# Patient Record
Sex: Female | Born: 1995 | Race: White | Hispanic: No | Marital: Single | Smoking: Never smoker
Health system: Southern US, Community
[De-identification: ages and names within clinical notes are randomized; demographics above are authoritative.]

## PROBLEM LIST (undated history)

## (undated) DIAGNOSIS — E611 Iron deficiency: Secondary | ICD-10-CM

## (undated) DIAGNOSIS — R519 Headache, unspecified: Secondary | ICD-10-CM

## (undated) DIAGNOSIS — J45909 Unspecified asthma, uncomplicated: Secondary | ICD-10-CM

## (undated) HISTORY — DX: Unspecified asthma, uncomplicated: J45.909

## (undated) HISTORY — PX: NO PAST SURGERIES: SHX2092

## (undated) HISTORY — DX: Iron deficiency: E61.1

## (undated) HISTORY — DX: Headache, unspecified: R51.9

---

## 2019-04-17 ENCOUNTER — Encounter: Payer: Self-pay | Admitting: *Deleted

## 2019-04-17 ENCOUNTER — Other Ambulatory Visit: Payer: Self-pay

## 2019-04-17 ENCOUNTER — Ambulatory Visit: Payer: Medicaid Other | Admitting: Neurology

## 2019-04-17 ENCOUNTER — Encounter: Payer: Self-pay | Admitting: Neurology

## 2019-04-17 VITALS — BP 103/64 | HR 100 | Temp 98.6°F | Wt 132.5 lb

## 2019-04-17 DIAGNOSIS — IMO0002 Reserved for concepts with insufficient information to code with codable children: Secondary | ICD-10-CM

## 2019-04-17 DIAGNOSIS — R9089 Other abnormal findings on diagnostic imaging of central nervous system: Secondary | ICD-10-CM | POA: Insufficient documentation

## 2019-04-17 DIAGNOSIS — G43709 Chronic migraine without aura, not intractable, without status migrainosus: Secondary | ICD-10-CM | POA: Diagnosis not present

## 2019-04-17 MED ORDER — MAGNESIUM OXIDE 400 MG PO CAPS: 400.0000 mg | ORAL_CAPSULE | Freq: Two times a day (BID) | ORAL | 11 refills | Status: DC

## 2019-04-17 MED ORDER — RIBOFLAVIN 100 MG PO CAPS: 100.0000 mg | ORAL_CAPSULE | Freq: Two times a day (BID) | ORAL | 11 refills | Status: DC

## 2019-04-17 MED ORDER — BUTALBITAL-APAP-CAFFEINE 50-325-40 MG PO TABS: 1.0000 | ORAL_TABLET | Freq: Four times a day (QID) | ORAL | 5 refills | Status: DC | PRN

## 2019-04-17 NOTE — Progress Notes (Addendum)
PATIENT: Monique HailStephanie Flores DOB: 11/25/1995  Chief Complaint  Patient presents with  . Headache    She is 30 weeks with her fourth child. She is here with her mother, Monique Flores. She estimates getting a headache every 1-2 days.  She takes Tylenol which mildly helps.  She sometimes has nausea, vomiting, blurred vision and light/noise sensitivity.   Marland Kitchen. PCP    Monique Flores, Monique Flores, Monique Flores     HISTORICAL   Monique Flores is a [age] year old female, seen in request by her primary care physician Monique Flores, Monique Flores for evaluation of headaches, she is currently [redacted] weeks pregnant, she is accompanied by her mother Monique Flores at today'Flores clinic visit on April 17, 2019.  I have reviewed and summarized the referring note from the referring physician.  She began to have migraine headaches since 23 years old, her typical migraine are bilateral frontal retro-orbital area pounding headache with associated light noise sensitivity, nauseous, lasting for few hours to few days  Over the past few years, she complains of increased frequency of migraine headache, especially since her most recent pregnancy, since 23, she been having headaches 2-3 times each week, she usually suffers through it, recently tried Tylenol with limited help, has never taken preventive medication or triptan in the past  Patient reported she was seen by [facility] in the past for abnormal MRI of the brain, around 23 years old, had a yearly MRI brain follow-up for a while, eventually lost to follow-up.  She denies visual loss, no lateralized motor or sensory deficit   REVIEW OF SYSTEMS: Full 14 system review of systems performed and notable only for as above All other review of systems were negative.  ALLERGIES: No Known Allergies  HOME MEDICATIONS: Current Outpatient Medications  Medication Sig Dispense Refill  . Ascorbic Acid (VITAMIN C PO) Take 1 tablet by mouth daily.    . Ferrous Sulfate (IRON) 325 (65 Fe) MG TABS Take 1 capsule by  mouth daily.    . Prenatal Vit-Fe Fumarate-FA (PRENATAL MULTIVITAMIN) TABS tablet Take 1 tablet by mouth daily at 12 noon.     No current facility-administered medications for this visit.     PAST MEDICAL HISTORY: Past Medical History:  Diagnosis Date  . Asthma   . Headache   . Iron deficiency     PAST SURGICAL HISTORY: Past Surgical History:  Procedure Laterality Date  . NO PAST SURGERIES      FAMILY HISTORY: Family History  Problem Relation Age of Onset  . Other Father        unsure of hx - not regularly involved - reported drug abuser  . Diabetes Maternal Grandmother   . Hypertension Maternal Grandfather   . Hypertension Mother     SOCIAL HISTORY: Social History   Socioeconomic History  . Marital status: Single    Spouse name: Not on file  . Number of children: 3  . Years of education: 5512  . Highest education level: Not on file  Occupational History  . Occupation: monitors machines  Social Needs  . Financial resource strain: Not on file  . Food insecurity    Worry: Not on file    Inability: Not on file  . Transportation needs    Medical: Not on file    Non-medical: Not on file  Tobacco Use  . Smoking status: Never Smoker  . Smokeless tobacco: Never Used  Substance and Sexual Activity  . Alcohol use: Not Currently  . Drug use: Not Currently  .  Sexual activity: Not on file  Lifestyle  . Physical activity    Days per week: Not on file    Minutes per session: Not on file  . Stress: Not on file  Relationships  . Social Herbalist on phone: Not on file    Gets together: Not on file    Attends religious service: Not on file    Active member of club or organization: Not on file    Attends meetings of clubs or organizations: Not on file    Relationship status: Not on file  . Intimate partner violence    Fear of current or ex partner: Not on file    Emotionally abused: Not on file    Physically abused: Not on file    Forced sexual activity:  Not on file  Other Topics Concern  . Not on file  Social History Narrative   Lives at home with fiance, children and maternal aunt.  She has three children.  Pregnant with fourth with EDD 06/23/2019.   Right-handed.   Caffeine use:  2-3 cups per day.     PHYSICAL EXAM   Vitals:   04/17/19 1522  BP: 103/64  Pulse: 100  Temp: 98.6 F (37 C)  Weight: 132 lb 8 oz (60.1 kg)    Not recorded      Body mass index is 23.85 kg/m.  PHYSICAL EXAMNIATION:  Gen: NAD, conversant, well nourised, well groomed                     Cardiovascular: Regular rate rhythm, no peripheral edema, warm, nontender. Eyes: Conjunctivae clear without exudates or hemorrhage Neck: Supple, no carotid bruits. Pulmonary: Clear to auscultation bilaterally   NEUROLOGICAL EXAM:  MENTAL STATUS: Speech:    Speech is normal; fluent and spontaneous with normal comprehension.  Cognition:     Orientation to time, place and person     Normal recent and remote memory     Normal Attention span and concentration     Normal Language, naming, repeating,spontaneous speech     Fund of knowledge   CRANIAL NERVES: CN II: Visual fields are full to confrontation.  Pupils are round equal and briskly reactive to light. CN III, IV, VI: extraocular movement are normal. No ptosis. CN V: Facial sensation is intact to pinprick in all 3 divisions bilaterally. Corneal responses are intact.  CN VII: Face is symmetric with normal eye closure and smile. CN VIII: Hearing is normal to causal conversation. CN IX, X: Palate elevates symmetrically. Phonation is normal. CN XI: Head turning and shoulder shrug are intact CN XII: Tongue is midline with normal movements and no atrophy.  MOTOR: There is no pronator drift of out-stretched arms. Muscle bulk and tone are normal. Muscle strength is normal.  REFLEXES: Reflexes are 2+ and symmetric at the biceps, triceps, knees, and ankles. Plantar responses are flexor.  SENSORY: Intact to  light touch, pinprick, positional sensation and vibratory sensation are intact in fingers and toes.  COORDINATION: Rapid alternating movements and fine finger movements are intact. There is no dysmetria on finger-to-nose and heel-knee-shin.    GAIT/STANCE: Posture is normal. Gait is steady with normal steps, base, arm swing, and turning. Heel and toe walking are normal. Tandem gait is normal.  Romberg is absent.   DIAGNOSTIC DATA (LABS, IMAGING, TESTING) - I reviewed patient records, labs, notes, testing and imaging myself where available.   ASSESSMENT AND PLAN  Evvie Behrmann is a 23 y.o.  female   Chronic migraine headaches  Magnesium oxide 400 mg, riboflavin 100 mg twice a day as preventive medications  Fioricet as needed  May consider repeat MRI of the brain, triptan treatment after delivery, she is planning on nursing  Levert Feinstein, M.D. Ph.D.  [facility] [address], [address] [place], [place] [address] Ph: [phone] Fax: [phone]  CC: Monique Ponto, Monique Flores

## 2019-07-24 ENCOUNTER — Ambulatory Visit: Payer: Medicaid Other | Admitting: Neurology

## 2019-07-24 ENCOUNTER — Telehealth: Payer: Self-pay | Admitting: *Deleted

## 2019-07-24 NOTE — Telephone Encounter (Signed)
Pt called at 12:03 to cancel 1:30 appt.

## 2019-09-18 ENCOUNTER — Encounter: Payer: Self-pay | Admitting: Neurology

## 2019-09-18 ENCOUNTER — Ambulatory Visit: Payer: Medicaid Other | Admitting: Neurology

## 2019-09-18 ENCOUNTER — Telehealth: Payer: Self-pay | Admitting: *Deleted

## 2019-09-18 NOTE — Telephone Encounter (Signed)
No showed follow up appointment. 

## 2019-10-28 ENCOUNTER — Ambulatory Visit (INDEPENDENT_AMBULATORY_CARE_PROVIDER_SITE_OTHER): Payer: Medicaid Other | Admitting: Neurology

## 2019-10-28 ENCOUNTER — Encounter: Payer: Self-pay | Admitting: Neurology

## 2019-10-28 ENCOUNTER — Telehealth: Payer: Self-pay | Admitting: Neurology

## 2019-10-28 ENCOUNTER — Other Ambulatory Visit: Payer: Self-pay

## 2019-10-28 VITALS — BP 94/68 | HR 70 | Temp 98.3°F | Ht 62.5 in | Wt 118.5 lb

## 2019-10-28 DIAGNOSIS — IMO0002 Reserved for concepts with insufficient information to code with codable children: Secondary | ICD-10-CM

## 2019-10-28 DIAGNOSIS — R9089 Other abnormal findings on diagnostic imaging of central nervous system: Secondary | ICD-10-CM

## 2019-10-28 DIAGNOSIS — G43709 Chronic migraine without aura, not intractable, without status migrainosus: Secondary | ICD-10-CM | POA: Diagnosis not present

## 2019-10-28 MED ORDER — ONDANSETRON 4 MG PO TBDP: 4.0000 mg | ORAL_TABLET | Freq: Three times a day (TID) | ORAL | 6 refills | Status: AC | PRN

## 2019-10-28 MED ORDER — NORTRIPTYLINE HCL 25 MG PO CAPS: 50.0000 mg | ORAL_CAPSULE | Freq: Every day | ORAL | 11 refills | Status: DC

## 2019-10-28 MED ORDER — RIZATRIPTAN BENZOATE 10 MG PO TBDP
10.0000 mg | ORAL_TABLET | ORAL | 6 refills | Status: DC | PRN
Start: 2019-10-28 — End: 2021-07-12

## 2019-10-28 NOTE — Telephone Encounter (Signed)
Medicaid order sent to [facility]. They will obtain the auth and reach out to the patient to schedule.  

## 2019-10-28 NOTE — Progress Notes (Signed)
PATIENT: Monique Flores DOB: Mar 07, 1996  Chief Complaint  Patient presents with  . Flores    She is no longer taking any medications or supplements to prevent migraines. Reports having a headache nearly every day in varying degrees of pain. She takes Monique Flores which does not provide much relief. She delivered her fourth girl on 06/01/20 without incident. Her other daughters are ages 69.4 and 1.      HISTORICAL   Monique Flores is a 24 year old female, seen in request by her primary care physician Dr. Leonor Flores, Mendel Corning for evaluation of headaches, she is currently [redacted] weeks pregnant, she is accompanied by her mother Monique Flores at today's clinic visit on April 17, 2019.  I have reviewed and summarized the referring note from the referring physician.  She began to have Flores headaches since 24 years old, her typical Flores are bilateral frontal retro-orbital area pounding headache with associated light noise sensitivity, nauseous, lasting for few hours to few days  Over the past few years, she complains of increased frequency of Flores headache, especially since her most recent pregnancy, since 2019, she been having headaches 2-3 times each week, she usually suffers through it, recently tried Monique Flores with limited help, has never taken preventive medication or triptan in the past  Patient reported she was seen by [facility] Flores neurology in the past for abnormal MRI of the brain, around 24 years old, had a yearly MRI brain follow-up for a while, eventually lost to follow-up.  She denies visual loss, no lateralized motor or sensory deficit  Update Oct 28, 2019: She had a healthy delivery in December 2020, continue complains daily headache, moderate to severe, sometimes 10 out of 10, with associated light, noise, smell sensitivity, lasting 1 to 3 days, she has tried over-the-counter Monique Flores, Monique Flores with limited help  She denies focal symptoms, she also complains of poor sleep quality, she  is a mother of 4 children at age 74, 20, 19 years old and 68 months old, also work part-time job during the weekend   REVIEW OF SYSTEMS: Full 14 system review of systems performed and notable only for as above All other review of systems were negative.  ALLERGIES: No Known Allergies  HOME MEDICATIONS: Current Outpatient Medications  Medication Sig Dispense Refill  . medroxyPROGESTERone (DEPO-PROVERA) 150 MG/ML injection Inject 150 mg into the muscle every 3 (three) months.     No current facility-administered medications for this visit.    PAST MEDICAL HISTORY: Past Medical History:  Diagnosis Date  . Asthma   . Headache   . Iron deficiency     PAST SURGICAL HISTORY: Past Surgical History:  Procedure Laterality Date  . NO PAST SURGERIES      FAMILY HISTORY: Family History  Problem Relation Age of Onset  . Other Father        unsure of hx - not regularly involved - reported drug abuser  . Diabetes Maternal Grandmother   . Hypertension Maternal Grandfather   . Hypertension Mother     SOCIAL HISTORY: Social History   Socioeconomic History  . Marital status: Single    Spouse name: Not on file  . Number of children: 4  . Years of education: 4  . Highest education level: Not on file  Occupational History  . Occupation: monitors machines  Tobacco Use  . Smoking status: Never Smoker  . Smokeless tobacco: Never Used  Substance and Sexual Activity  . Alcohol use: Not Currently  . Drug use: Not Currently  .  Sexual activity: Not on file  Other Topics Concern  . Not on file  Social History Narrative   Lives at home with fiance, children and maternal aunt.  She has three children.  Pregnant with fourth with EDD 06/23/2019.   Right-handed.   Caffeine use:  2-3 cups per day.   Social Determinants of Health   Financial Resource Strain:   . Difficulty of Paying Living Expenses:   Food Insecurity:   . Worried About Charity fundraiser in the Last Year:   . Academic librarian in the Last Year:   Transportation Needs:   . Film/video editor (Medical):   Marland Kitchen Lack of Transportation (Non-Medical):   Physical Activity:   . Days of Exercise per Week:   . Minutes of Exercise per Session:   Stress:   . Feeling of Stress :   Social Connections:   . Frequency of Communication with Friends and Family:   . Frequency of Social Gatherings with Friends and Family:   . Attends Religious Services:   . Active Member of Clubs or Organizations:   . Attends Archivist Meetings:   Marland Kitchen Marital Status:   Intimate Partner Violence:   . Fear of Current or Ex-Partner:   . Emotionally Abused:   Marland Kitchen Physically Abused:   . Sexually Abused:      PHYSICAL EXAM   Vitals:   10/28/19 1125  BP: 94/68  Pulse: 70  Temp: 98.3 F (36.8 C)  Weight: 118 lb 8 oz (53.8 kg)  Height: 5' 2.5" (1.588 m)    Not recorded      Body mass index is 21.33 kg/m.  PHYSICAL EXAMNIATION:  Gen: NAD, conversant, well nourised, well groomed                     Cardiovascular: Regular rate rhythm, no peripheral edema, warm, nontender. Eyes: Conjunctivae clear without exudates or hemorrhage Neck: Supple, no carotid bruits. Pulmonary: Clear to auscultation bilaterally   NEUROLOGICAL EXAM:  MENTAL STATUS: Speech:    Speech is normal; fluent and spontaneous with normal comprehension.  Cognition:     Orientation to time, place and person     Normal recent and remote memory     Normal Attention span and concentration     Normal Language, naming, repeating,spontaneous speech     Fund of knowledge   CRANIAL NERVES: CN II: Visual fields are full to confrontation.  Pupils are round equal and briskly reactive to light. CN III, IV, VI: extraocular movement are normal. No ptosis. CN V: Facial sensation is intact to pinprick in all 3 divisions bilaterally. Corneal responses are intact.  CN VII: Face is symmetric with normal eye closure and smile. CN VIII: Hearing is normal to causal  conversation. CN IX, X: Palate elevates symmetrically. Phonation is normal. CN XI: Head turning and shoulder shrug are intact CN XII: Tongue is midline with normal movements and no atrophy.  MOTOR: There is no pronator drift of out-stretched arms. Muscle bulk and tone are normal. Muscle strength is normal.  REFLEXES: Reflexes are 2+ and symmetric at the biceps, triceps, knees, and ankles. Plantar responses are flexor.  SENSORY: Intact to light touch, pinprick, positional sensation and vibratory sensation are intact in fingers and toes.  COORDINATION: Rapid alternating movements and fine finger movements are intact. There is no dysmetria on finger-to-nose and heel-knee-shin.    GAIT/STANCE: Posture is normal. Gait is steady with normal steps, base, arm swing, and  turning. Heel and toe walking are normal. Tandem gait is normal.  Romberg is absent.   DIAGNOSTIC DATA (LABS, IMAGING, TESTING) - I reviewed patient records, labs, notes, testing and imaging myself where available.   ASSESSMENT AND PLAN  Monique Flores is a 24 y.o. female   Chronic Flores headaches  She had a healthy delivery in December 2020, no longer nursing  Continue complains of daily headaches, failed to respond to over-the-counter medication,  Maxalt as needed, may combine with Aleve, Zofran  Nortriptyline 25 mg titrating to 50 mg every night as preventive medication  Abnormal MRI of brain  Repeat MRI of the brain with without contrast  Laboratory evaluation including BUN/creatinine    Levert Feinstein, M.D. Ph.D.  [facility] [address] [place], [place] [address] Ph: [phone] Fax: [phone]  CC: Marylen Ponto, MD

## 2019-10-29 ENCOUNTER — Telehealth: Payer: Self-pay | Admitting: Neurology

## 2019-10-29 LAB — COMPREHENSIVE METABOLIC PANEL
ALT: 8 IU/L (ref 0–32)
AST: 15 IU/L (ref 0–40)
Albumin/Globulin Ratio: 1.8 (ref 1.2–2.2)
Albumin: 4.4 g/dL (ref 3.9–5.0)
Alkaline Phosphatase: 75 IU/L (ref 39–117)
BUN/Creatinine Ratio: 10 (ref 9–23)
BUN: 9 mg/dL (ref 6–20)
Bilirubin Total: 0.6 mg/dL (ref 0.0–1.2)
CO2: 22 mmol/L (ref 20–29)
Calcium: 9.1 mg/dL (ref 8.7–10.2)
Chloride: 103 mmol/L (ref 96–106)
Creatinine, Ser: 0.88 mg/dL (ref 0.57–1.00)
GFR calc Af Amer: 107 mL/min/{1.73_m2} (ref >59)
GFR calc non Af Amer: 93 mL/min/{1.73_m2} (ref >59)
Globulin, Total: 2.4 g/dL (ref 1.5–4.5)
Glucose: 74 mg/dL (ref 65–99)
Potassium: 4.2 mmol/L (ref 3.5–5.2)
Sodium: 139 mmol/L (ref 134–144)
Total Protein: 6.8 g/dL (ref 6.0–8.5)

## 2019-10-29 LAB — CBC WITH DIFFERENTIAL/PLATELET
Basophils Absolute: 0.1 10*3/uL (ref 0.0–0.2)
Basos: 1 %
EOS (ABSOLUTE): 0.2 10*3/uL (ref 0.0–0.4)
Eos: 4 %
Hematocrit: 36.5 % (ref 34.0–46.6)
Hemoglobin: 11.4 g/dL (ref 11.1–15.9)
Immature Grans (Abs): 0 10*3/uL (ref 0.0–0.1)
Immature Granulocytes: 0 %
Lymphocytes Absolute: 1.7 10*3/uL (ref 0.7–3.1)
Lymphs: 35 %
MCH: 23.9 pg — ABNORMAL LOW (ref 26.6–33.0)
MCHC: 31.2 g/dL — ABNORMAL LOW (ref 31.5–35.7)
MCV: 77 fL — ABNORMAL LOW (ref 79–97)
Monocytes Absolute: 0.5 10*3/uL (ref 0.1–0.9)
Monocytes: 11 %
Neutrophils Absolute: 2.5 10*3/uL (ref 1.4–7.0)
Neutrophils: 49 %
Platelets: 311 10*3/uL (ref 150–450)
RBC: 4.76 x10E6/uL (ref 3.77–5.28)
RDW: 14.5 % (ref 11.7–15.4)
WBC: 5 10*3/uL (ref 3.4–10.8)

## 2019-10-29 LAB — TSH: TSH: 0.793 u[IU]/mL (ref 0.450–4.500)

## 2019-10-29 NOTE — Telephone Encounter (Signed)
Left patient a detailed message, with results and instructions to follow up with PCP (ok per DPR).  Provided our number to call back with any questions.

## 2019-10-29 NOTE — Addendum Note (Signed)
Addended by: Levert Feinstein on: 10/29/2019 09:11 AM   Modules accepted: Level of Service

## 2019-10-29 NOTE — Telephone Encounter (Signed)
Please call patient, laboratory evaluation showed low normal range of hemoglobin, with evidence of small red blood cells, she may continue follow-up with her primary care physician to rule out iron deficiency anemia  Rest of the laboratory evaluation was normal

## 2020-01-13 ENCOUNTER — Other Ambulatory Visit: Payer: Self-pay | Admitting: Neurology

## 2020-01-13 DIAGNOSIS — IMO0002 Reserved for concepts with insufficient information to code with codable children: Secondary | ICD-10-CM

## 2020-01-13 DIAGNOSIS — R9089 Other abnormal findings on diagnostic imaging of central nervous system: Secondary | ICD-10-CM

## 2020-01-21 NOTE — Telephone Encounter (Signed)
[employer] [facility] [place]: [id number] (exp. 01/21/20 to 03/06/20) patient is scheduled at [facility] for 01/23/20.

## 2020-01-23 ENCOUNTER — Ambulatory Visit
Admission: RE | Admit: 2020-01-23 | Discharge: 2020-01-23 | Disposition: A | Discharge status: Home / Self Care | Payer: Medicaid Other | Source: Ambulatory Visit | Attending: Neurology | Admitting: Neurology

## 2020-01-23 ENCOUNTER — Other Ambulatory Visit: Payer: Self-pay

## 2020-01-23 ENCOUNTER — Other Ambulatory Visit: Payer: Self-pay | Admitting: Neurology

## 2020-01-23 DIAGNOSIS — Z77018 Contact with and (suspected) exposure to other hazardous metals: Secondary | ICD-10-CM

## 2020-01-23 DIAGNOSIS — IMO0002 Reserved for concepts with insufficient information to code with codable children: Secondary | ICD-10-CM

## 2020-01-23 DIAGNOSIS — R9089 Other abnormal findings on diagnostic imaging of central nervous system: Secondary | ICD-10-CM

## 2020-01-23 DIAGNOSIS — G43709 Chronic migraine without aura, not intractable, without status migrainosus: Secondary | ICD-10-CM | POA: Diagnosis not present

## 2020-01-23 MED ORDER — GADOBENATE DIMEGLUMINE 529 MG/ML IV SOLN
10.0000 mL | Freq: Once | INTRAVENOUS | Status: AC | PRN
  Administered 2020-01-23: 10 mL via INTRAVENOUS

## 2020-01-26 ENCOUNTER — Telehealth: Payer: Self-pay | Admitting: Neurology

## 2020-01-26 NOTE — Telephone Encounter (Signed)
MRI of the IMPRESSION: Abnormal MRI scan of the brain with and without contrast showing nonspecific periventricular and subcortical white matter hyperintensities with the differential discussed above.  No enhancing lesions are noted.  Please call patient, brain showed right frontal subcortical area white matter hypodensity lesion, 1.3 x 0.7 cm, no contrast-enhancement, this can be migraine related, or small vessel disease, there was no acute abnormalities.

## 2020-01-26 NOTE — Telephone Encounter (Signed)
I provided the patient with her MRI brain results as noted below. She verbalized understanding. She would like to further review at her next appt on 01/29/20.

## 2020-01-29 ENCOUNTER — Ambulatory Visit: Payer: Medicaid Other | Admitting: Neurology

## 2020-01-29 ENCOUNTER — Encounter: Payer: Self-pay | Admitting: Neurology

## 2020-01-29 NOTE — Progress Notes (Deleted)
PATIENT: Monique Flores DOB: 1995-08-01  REASON FOR VISIT: follow up HISTORY FROM: patient  HISTORY OF PRESENT ILLNESS: Today 01/29/20  HISTORY Monique Flores is a 24 year old female, seen in request by her primary care physician Dr. Leonor Liv, [facility] Corning for evaluation of headaches, she is currently [redacted] weeks pregnant, she is accompanied by her mother [name] at today's clinic visit on April 17, 2019.  I have reviewed and summarized the referring note from the referring physician.  She began to have migraine headaches since 24 years old, her typical migraine are bilateral frontal retro-orbital area pounding headache with associated light noise sensitivity, nauseous, lasting for few hours to few days  Over the past few years, she complains of increased frequency of migraine headache, especially since her most recent pregnancy, since 2019, she been having headaches 2-3 times each week, she usually suffers through it, recently tried Tylenol with limited help, has never taken preventive medication or triptan in the past  Patient reported she was seen by [facility] neurology in the past for abnormal MRI of the brain, around 24 years old, had a yearly MRI brain follow-up for a while, eventually lost to follow-up.  She denies visual loss, no lateralized motor or sensory deficit  Update Oct 28, 2019: She had a healthy delivery in December 2020, continue complains daily headache, moderate to severe, sometimes 10 out of 10, with associated light, noise, smell sensitivity, lasting 1 to 3 days, she has tried over-the-counter Tylenol, Excedrin Migraine with limited help  She denies focal symptoms, she also complains of poor sleep quality, she is a mother of 4 children at age 33, 37, 63 years old and 64 months old, also work part-time job during the weekend  Update January 29, 2020 SS: Laboratory evaluation showed low normal range of hemoglobin with evidence of small RBC, recommend he continue to work with  PCP to rule out iron deficiency anemia, CMP, TSH was normal.  MRI of the brain in August 2021 showed right frontal subcortical area white matter hypodensity lesion 1.3x 0.7 cm, no contrast-enhancement, could be migraine related or small vessel disease, no acute abnormality.  REVIEW OF SYSTEMS: Out of a complete 14 system review of symptoms, the patient complains only of the following symptoms, and all other reviewed systems are negative.  ALLERGIES: No Known Allergies  HOME MEDICATIONS: Outpatient Medications Prior to Visit  Medication Sig Dispense Refill  . medroxyPROGESTERone (DEPO-PROVERA) 150 MG/ML injection Inject 150 mg into the muscle every 3 (three) months.    . nortriptyline (PAMELOR) 25 MG capsule Take 2 capsules (50 mg total) by mouth at bedtime. 60 capsule 11  . ondansetron (ZOFRAN ODT) 4 MG disintegrating tablet Take 1 tablet (4 mg total) by mouth every 8 (eight) hours as needed. 20 tablet 6  . rizatriptan (MAXALT-MLT) 10 MG disintegrating tablet Take 1 tablet (10 mg total) by mouth as needed. May repeat in 2 hours if needed 15 tablet 6   No facility-administered medications prior to visit.    PAST MEDICAL HISTORY: Past Medical History:  Diagnosis Date  . Asthma   . Headache   . Iron deficiency     PAST SURGICAL HISTORY: Past Surgical History:  Procedure Laterality Date  . NO PAST SURGERIES      FAMILY HISTORY: Family History  Problem Relation Age of Onset  . Other Father        unsure of hx - not regularly involved - reported drug abuser  . Diabetes Maternal Grandmother   . Hypertension  Maternal Grandfather   . Hypertension Mother     SOCIAL HISTORY: Social History   Socioeconomic History  . Marital status: Single    Spouse name: Not on file  . Number of children: 4  . Years of education: 85  . Highest education level: Not on file  Occupational History  . Occupation: monitors machines  Tobacco Use  . Smoking status: Never Smoker  . Smokeless  tobacco: Never Used  Substance and Sexual Activity  . Alcohol use: Not Currently  . Drug use: Not Currently  . Sexual activity: Not on file  Other Topics Concern  . Not on file  Social History Narrative   Lives at home with fiance, children and maternal aunt.  She has three children.  Pregnant with fourth with EDD 06/23/2019.   Right-handed.   Caffeine use:  2-3 cups per day.   Social Determinants of Health   Financial Resource Strain:   . Difficulty of Paying Living Expenses:   Food Insecurity:   . Worried About Programme researcher, broadcasting/film/video in the Last Year:   . Barista in the Last Year:   Transportation Needs:   . Freight forwarder (Medical):   Marland Kitchen Lack of Transportation (Non-Medical):   Physical Activity:   . Days of Exercise per Week:   . Minutes of Exercise per Session:   Stress:   . Feeling of Stress :   Social Connections:   . Frequency of Communication with Friends and Family:   . Frequency of Social Gatherings with Friends and Family:   . Attends Religious Services:   . Active Member of Clubs or Organizations:   . Attends Banker Meetings:   Marland Kitchen Marital Status:   Intimate Partner Violence:   . Fear of Current or Ex-Partner:   . Emotionally Abused:   Marland Kitchen Physically Abused:   . Sexually Abused:       PHYSICAL EXAM  There were no vitals filed for this visit. There is no height or weight on file to calculate BMI.  Generalized: Well developed, in no acute distress   Neurological examination  Mentation: Alert oriented to time, place, history taking. Follows all commands speech and language fluent Cranial nerve II-XII: Pupils were equal round reactive to light. Extraocular movements were full, visual field were full on confrontational test. Facial sensation and strength were normal. Uvula tongue midline. Head turning and shoulder shrug  were normal and symmetric. Motor: The motor testing reveals 5 over 5 strength of all 4 extremities. Good symmetric  motor tone is noted throughout.  Sensory: Sensory testing is intact to soft touch on all 4 extremities. No evidence of extinction is noted.  Coordination: Cerebellar testing reveals good finger-nose-finger and heel-to-shin bilaterally.  Gait and station: Gait is normal. Tandem gait is normal. Romberg is negative. No drift is seen.  Reflexes: Deep tendon reflexes are symmetric and normal bilaterally.   DIAGNOSTIC DATA (LABS, IMAGING, TESTING) - I reviewed patient records, labs, notes, testing and imaging myself where available.  Lab Results  Component Value Date   WBC 5.0 10/28/2019   HGB 11.4 10/28/2019   HCT 36.5 10/28/2019   MCV 77 (L) 10/28/2019   PLT 311 10/28/2019      Component Value Date/Time   NA 139 10/28/2019 1235   K 4.2 10/28/2019 1235   CL 103 10/28/2019 1235   CO2 22 10/28/2019 1235   GLUCOSE 74 10/28/2019 1235   BUN 9 10/28/2019 1235   CREATININE 0.88 10/28/2019  1235   CALCIUM 9.1 10/28/2019 1235   PROT 6.8 10/28/2019 1235   ALBUMIN 4.4 10/28/2019 1235   AST 15 10/28/2019 1235   ALT 8 10/28/2019 1235   ALKPHOS 75 10/28/2019 1235   BILITOT 0.6 10/28/2019 1235   GFRNONAA 93 10/28/2019 1235   GFRAA 107 10/28/2019 1235   No results found for: CHOL, HDL, LDLCALC, LDLDIRECT, TRIG, CHOLHDL No results found for: JSEG3T No results found for: VITAMINB12 Lab Results  Component Value Date   TSH 0.793 10/28/2019      ASSESSMENT AND PLAN 24 y.o. year old female  has a past medical history of Asthma, Headache, and Iron deficiency. here with:  1.  Chronic migraine headache 2.  Abnormal MRI of the brain -MRI of the brain in August 2021 showed right frontal subcortical area white matter hypodensity lesion 1.3x 0.7 cm, no contrast-enhancement, could be migraine related or small vessel disease, no acute abnormality.   I spent 15 minutes with the patient. 50% of this time was spent   Margie Ege, [place], DNP 01/29/2020, 5:54 AM [facility] [address] [place], [place] [address] [phone]

## 2020-04-06 ENCOUNTER — Ambulatory Visit: Payer: Medicaid Other | Admitting: Neurology

## 2020-04-06 ENCOUNTER — Encounter: Payer: Self-pay | Admitting: Neurology

## 2020-04-06 VITALS — BP 112/68 | HR 72 | Ht 62.5 in | Wt 112.2 lb

## 2020-04-06 DIAGNOSIS — G43709 Chronic migraine without aura, not intractable, without status migrainosus: Secondary | ICD-10-CM | POA: Diagnosis not present

## 2020-04-06 DIAGNOSIS — R9089 Other abnormal findings on diagnostic imaging of central nervous system: Secondary | ICD-10-CM | POA: Diagnosis not present

## 2020-04-06 NOTE — Patient Instructions (Signed)
Take nortriptyline every night at bedtime Take Maxalt as needed for acute headache Recommend starting your birth control  Return in 6 months

## 2020-04-06 NOTE — Progress Notes (Signed)
PATIENT: Monique Flores DOB: [birth year]  REASON FOR VISIT: follow up HISTORY FROM: patient  HISTORY OF PRESENT ILLNESS: Today 04/06/20  HISTORY  Monique Flores is a 24 year old female, seen in request by her primary care physician Dr. Leonor Liv, [facility] Corning for evaluation of headaches, she is currently [redacted] weeks pregnant, she is accompanied by her mother [name] at today's clinic visit on April 17, 2019.  I have reviewed and summarized the referring note from the referring physician.  She began to have migraine headaches since 24 years old, her typical migraine are bilateral frontal retro-orbital area pounding headache with associated light noise sensitivity, nauseous, lasting for few hours to few days  Over the past few years, she complains of increased frequency of migraine headache, especially since her most recent pregnancy, since 2019, she been having headaches 2-3 times each week, she usually suffers through it, recently tried Tylenol with limited help, has never taken preventive medication or triptan in the past  Patient reported she was seen by [facility] neurology in the past for abnormal MRI of the brain, around 24 years old, had a yearly MRI brain follow-up for a while, eventually lost to follow-up.  She denies visual loss, no lateralized motor or sensory deficit  Update Oct 28, 2019: She had a healthy delivery in December 2020, continue complains daily headache, moderate to severe, sometimes 10 out of 10, with associated light, noise, smell sensitivity, lasting 1 to 3 days, she has tried over-the-counter Tylenol, Excedrin Migraine with limited help  She denies focal symptoms, she also complains of poor sleep quality, she is a mother of 4 children at age 27, 45, 60 years old and 7 months old, also work part-time job during the weekend  Update April 06, 2020 SS: Laboratory evaluation revealed low normal hemoglobin 11.4, decreased MCV, MCH, MCHC, suspicious for IDA, working with  PCP.  CMP, TSH unremarkable.  MRI of the brain in August 2021 showed right frontal subcortical area white matter lesion 1.3 x 0.7 cm, no contrast-enhancement, could be migraine related or small vessel disease, no acute abnormalities.  Taking nortriptyline 50 mg on as-needed basis, 2 to 4 migraines a week, takes Maxalt with good benefit.  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  Switched birth control from Depo to pills, but has not started, apparently isn't planning for more children.  Here today accompanied by her fianc.  REVIEW OF SYSTEMS: Out of a complete 14 system review of symptoms, the patient complains only of the following symptoms, and all other reviewed systems are negative.  Headache  ALLERGIES: No Known Allergies  HOME MEDICATIONS: Outpatient Medications Prior to Visit  Medication Sig Dispense Refill   nortriptyline (PAMELOR) 25 MG capsule Take 2 capsules (50 mg total) by mouth at bedtime. 60 capsule 11   ondansetron (ZOFRAN ODT) 4 MG disintegrating tablet Take 1 tablet (4 mg total) by mouth every 8 (eight) hours as needed. 20 tablet 6   rizatriptan (MAXALT-MLT) 10 MG disintegrating tablet Take 1 tablet (10 mg total) by mouth as needed. May repeat in 2 hours if needed 15 tablet 6   medroxyPROGESTERone (DEPO-PROVERA) 150 MG/ML injection Inject 150 mg into the muscle every 3 (three) months.     No facility-administered medications prior to visit.    PAST MEDICAL HISTORY: Past Medical History:  Diagnosis Date   Asthma    Headache    Iron deficiency     PAST SURGICAL HISTORY:  Past Surgical History:  Procedure Laterality Date   NO PAST SURGERIES      FAMILY HISTORY: Family History  Problem Relation Age of Onset   Other Father        unsure of hx - not regularly involved - reported drug abuser   Diabetes Maternal Grandmother    Hypertension Maternal Grandfather    Hypertension Mother      SOCIAL HISTORY: Social History   Socioeconomic History   Marital status: Single    Spouse name: Not on file   Number of children: 4   Years of education: 12   Highest education level: Not on file  Occupational History   Occupation: monitors machines  Tobacco Use   Smoking status: Never Smoker   Smokeless tobacco: Never Used  Substance and Sexual Activity   Alcohol use: Not Currently   Drug use: Not Currently   Sexual activity: Not on file  Other Topics Concern   Not on file  Social History Narrative   Lives at home with fiance, children and maternal aunt.  She has three children.  Pregnant with fourth with EDD 06/23/2019.   Right-handed.   Caffeine use:  2-3 cups per day.   Social Determinants of Health   Financial Resource Strain:    Difficulty of Paying Living Expenses: Not on file  Food Insecurity:    Worried About Programme researcher, broadcasting/film/video in the Last Year: Not on file   [employer] of Food in the Last Year: Not on file  Transportation Needs:    Lack of Transportation (Medical): Not on file   Lack of Transportation (Non-Medical): Not on file  Physical Activity:    Days of Exercise per Week: Not on file   Minutes of Exercise per Session: Not on file  Stress:    Feeling of Stress : Not on file  Social Connections:    Frequency of Communication with Friends and Family: Not on file   Frequency of Social Gatherings with Friends and Family: Not on file   Attends Religious Services: Not on file   Active Member of Clubs or Organizations: Not on file   Attends Banker Meetings: Not on file   Marital Status: Not on file  Intimate Partner Violence:    Fear of Current or Ex-Partner: Not on file   Emotionally Abused: Not on file   Physically Abused: Not on file   Sexually Abused: Not on file   PHYSICAL EXAM  Vitals:   04/06/20 1414  BP: 112/68  Pulse: 72  Weight: 112 lb 3.2 oz (50.9 kg)  Height: 5' 2.5" (1.588 m)   Body mass  index is 20.19 kg/m.  Generalized: Well developed, in no acute distress   Neurological examination  Mentation: Alert oriented to time, place, history taking. Follows all commands speech and language fluent Cranial nerve II-XII: Pupils were equal round reactive to light. Extraocular movements were full, visual field were full on confrontational test. Facial sensation and strength were normal. Head turning and shoulder shrug were normal and symmetric. Motor: The motor testing reveals 5 over 5 strength of all 4 extremities. Good symmetric motor tone is noted throughout.  Sensory: Sensory testing is intact to soft touch on all 4 extremities. No evidence of extinction is noted.  Coordination: Cerebellar testing reveals good finger-nose-finger and heel-to-shin bilaterally.  Gait and station: Gait is normal. Reflexes: Deep tendon reflexes are symmetric and normal bilaterally.   DIAGNOSTIC DATA (LABS, IMAGING, TESTING) - I reviewed patient records, labs,  notes, testing and imaging myself where available.  Lab Results  Component Value Date   WBC 5.0 10/28/2019   HGB 11.4 10/28/2019   HCT 36.5 10/28/2019   MCV 77 (L) 10/28/2019   PLT 311 10/28/2019      Component Value Date/Time   NA 139 10/28/2019 1235   K 4.2 10/28/2019 1235   CL 103 10/28/2019 1235   CO2 22 10/28/2019 1235   GLUCOSE 74 10/28/2019 1235   BUN 9 10/28/2019 1235   CREATININE 0.88 10/28/2019 1235   CALCIUM 9.1 10/28/2019 1235   PROT 6.8 10/28/2019 1235   ALBUMIN 4.4 10/28/2019 1235   AST 15 10/28/2019 1235   ALT 8 10/28/2019 1235   ALKPHOS 75 10/28/2019 1235   BILITOT 0.6 10/28/2019 1235   GFRNONAA 93 10/28/2019 1235   GFRAA 107 10/28/2019 1235   No results found for: CHOL, HDL, LDLCALC, LDLDIRECT, TRIG, CHOLHDL No results found for: ZYSA6T No results found for: VITAMINB12 Lab Results  Component Value Date   TSH 0.793 10/28/2019      ASSESSMENT AND PLAN 24 y.o. year old female  has a past medical history of  Asthma, Headache, and Iron deficiency. here with:  1.  Chronic migraine headache -2-4 migraines a week, taking nortriptyline PRN -Start taking nortriptyline working up to 50 mg at bedtime EVERY night for migraine prevention -Continue Maxalt as needed for acute headache -Talked about starting her birth control she has been prescribed -Follow-up here in 6 months or sooner if needed  2.  Abnormal MRI of the brain -MRI of the brain in August 2021 showed right frontal subcortical area white matter hypodensity lesion, 1.3 x 0.7 cm, no contrast-enhancement, could be migraine related or small vessel disease, no acute abnormalities  I spent 20 minutes of face-to-face and non-face-to-face time with patient.  This included previsit chart review, lab review, study review, order entry, electronic health record documentation, patient education.  Margie Ege, AGNP-C, DNP 04/06/2020, 2:39 PM [facility] [address], [address] [place], [place] [address] [phone]

## 2020-04-28 NOTE — Progress Notes (Signed)
I have reviewed and agreed above plan. 

## 2020-10-05 ENCOUNTER — Ambulatory Visit: Payer: Medicaid Other | Admitting: Neurology

## 2020-10-05 ENCOUNTER — Encounter: Payer: Self-pay | Admitting: Neurology

## 2020-10-05 NOTE — Progress Notes (Deleted)
PATIENT: Monique Flores DOB: Apr 08, 1996  REASON FOR VISIT: follow up HISTORY FROM: patient  HISTORY OF PRESENT ILLNESS: Today 10/05/20  HISTORY  Monique Flores is a 25 year old female, seen in request by her primary care physician Dr. Leonor Liv, [place] [facility] for evaluation of headaches, she is currently [redacted] weeks pregnant, she is accompanied by her mother Monique Flores at today's clinic visit on April 17, 2019.  I have reviewed and summarized the referring note from the referring physician.  She began to have migraine headaches since 25 years old, her typical migraine are bilateral frontal retro-orbital area pounding headache with associated light noise sensitivity, nauseous, lasting for few hours to few days  Over the past few years, she complains of increased frequency of migraine headache, especially since her most recent pregnancy, since 2019, she been having headaches 2-3 times each week, she usually suffers through it, recently tried Tylenol with limited help, has never taken preventive medication or triptan in the past  Patient reported she was seen by [facility] neurology in the past for abnormal MRI of the brain, around 25 years old, had a yearly MRI brain follow-up for a while, eventually lost to follow-up.  She denies visual loss, no lateralized motor or sensory deficit  Update Oct 28, 2019: She had a healthy delivery in December 2020, continue complains daily headache, moderate to severe, sometimes 10 out of 10, with associated light, noise, smell sensitivity, lasting 1 to 3 days, she has tried over-the-counter Tylenol, Excedrin Migraine with limited help  She denies focal symptoms, she also complains of poor sleep quality, she is a mother of 4 children at age 74, 55, 50 years old and 59 months old, also work part-time job during the weekend  Update April 06, 2020 SS: Laboratory evaluation revealed low normal hemoglobin 11.4, decreased MCV, MCH, MCHC, suspicious for IDA, working with  PCP.  CMP, TSH unremarkable.  MRI of the brain in August 2021 showed right frontal subcortical area white matter lesion 1.3 x 0.7 cm, no contrast-enhancement, could be migraine related or small vessel disease, no acute abnormalities.  Taking nortriptyline 50 mg on as-needed basis, 2 to 4 migraines a week, takes Maxalt with good benefit.  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  Switched birth control from Depo to pills, but has not started, apparently isn't planning for more children.  Here today accompanied by her fianc.  Update October 05, 2020 SS:   REVIEW OF SYSTEMS: Out of a complete 14 system review of symptoms, the patient complains only of the following symptoms, and all other reviewed systems are negative.  Headache  ALLERGIES: No Known Allergies  HOME MEDICATIONS: Outpatient Medications Prior to Visit  Medication Sig Dispense Refill  . nortriptyline (PAMELOR) 25 MG capsule Take 2 capsules (50 mg total) by mouth at bedtime. 60 capsule 11  . ondansetron (ZOFRAN ODT) 4 MG disintegrating tablet Take 1 tablet (4 mg total) by mouth every 8 (eight) hours as needed. 20 tablet 6  . rizatriptan (MAXALT-MLT) 10 MG disintegrating tablet Take 1 tablet (10 mg total) by mouth as needed. May repeat in 2 hours if needed 15 tablet 6   No facility-administered medications prior to visit.    PAST MEDICAL HISTORY: Past Medical History:  Diagnosis Date  . Asthma   . Headache   . Iron deficiency     PAST SURGICAL HISTORY: Past Surgical History:  Procedure Laterality Date  . NO PAST SURGERIES  FAMILY HISTORY: Family History  Problem Relation Age of Onset  . Other Father        unsure of hx - not regularly involved - reported drug abuser  . Diabetes Maternal Grandmother   . Hypertension Maternal Grandfather   . Hypertension Mother     SOCIAL HISTORY: Social History   Socioeconomic History  . Marital status:  Single    Spouse name: Not on file  . Number of children: 4  . Years of education: 3  . Highest education level: Not on file  Occupational History  . Occupation: monitors machines  Tobacco Use  . Smoking status: Never Smoker  . Smokeless tobacco: Never Used  Substance and Sexual Activity  . Alcohol use: Not Currently  . Drug use: Not Currently  . Sexual activity: Not on file  Other Topics Concern  . Not on file  Social History Narrative   Lives at home with fiance, children and maternal aunt.  She has three children.  Pregnant with fourth with EDD 06/23/2019.   Right-handed.   Caffeine use:  2-3 cups per day.   Social Determinants of Health   Financial Resource Strain: Not on file  Food Insecurity: Not on file  Transportation Needs: Not on file  Physical Activity: Not on file  Stress: Not on file  Social Connections: Not on file  Intimate Partner Violence: Not on file   PHYSICAL EXAM  There were no vitals filed for this visit. There is no height or weight on file to calculate BMI.  Generalized: Well developed, in no acute distress   Neurological examination  Mentation: Alert oriented to time, place, history taking. Follows all commands speech and language fluent Cranial nerve II-XII: Pupils were equal round reactive to light. Extraocular movements were full, visual field were full on confrontational test. Facial sensation and strength were normal. Head turning and shoulder shrug were normal and symmetric. Motor: The motor testing reveals 5 over 5 strength of all 4 extremities. Good symmetric motor tone is noted throughout.  Sensory: Sensory testing is intact to soft touch on all 4 extremities. No evidence of extinction is noted.  Coordination: Cerebellar testing reveals good finger-nose-finger and heel-to-shin bilaterally.  Gait and station: Gait is normal. Reflexes: Deep tendon reflexes are symmetric and normal bilaterally.   DIAGNOSTIC DATA (LABS, IMAGING,  TESTING) - I reviewed patient records, labs, notes, testing and imaging myself where available.  Lab Results  Component Value Date   WBC 5.0 10/28/2019   HGB 11.4 10/28/2019   HCT 36.5 10/28/2019   MCV 77 (L) 10/28/2019   PLT 311 10/28/2019      Component Value Date/Time   NA 139 10/28/2019 1235   K 4.2 10/28/2019 1235   CL 103 10/28/2019 1235   CO2 22 10/28/2019 1235   GLUCOSE 74 10/28/2019 1235   BUN 9 10/28/2019 1235   CREATININE 0.88 10/28/2019 1235   CALCIUM 9.1 10/28/2019 1235   PROT 6.8 10/28/2019 1235   ALBUMIN 4.4 10/28/2019 1235   AST 15 10/28/2019 1235   ALT 8 10/28/2019 1235   ALKPHOS 75 10/28/2019 1235   BILITOT 0.6 10/28/2019 1235   GFRNONAA 93 10/28/2019 1235   GFRAA 107 10/28/2019 1235   No results found for: CHOL, HDL, LDLCALC, LDLDIRECT, TRIG, CHOLHDL No results found for: ZSWF0X No results found for: VITAMINB12 Lab Results  Component Value Date   TSH 0.793 10/28/2019      ASSESSMENT AND PLAN 25 y.o. year old female  has a past  medical history of Asthma, Headache, and Iron deficiency. here with:  1.  Chronic migraine headache -2-4 migraines a week, taking nortriptyline PRN -Start taking nortriptyline working up to 50 mg at bedtime EVERY night for migraine prevention -Continue Maxalt as needed for acute headache -Talked about starting her birth control she has been prescribed -Follow-up here in 6 months or sooner if needed  2.  Abnormal MRI of the brain -MRI of the brain in August 2021 showed right frontal subcortical area white matter hypodensity lesion, 1.3 x 0.7 cm, no contrast-enhancement, could be migraine related or small vessel disease, no acute abnormalities  I spent 20 minutes of face-to-face and non-face-to-face time with patient.  This included previsit chart review, lab review, study review, order entry, electronic health record documentation, patient education.  Margie Ege, AGNP-C, DNP 10/05/2020, 5:53 AM [facility] [address], [address] [place], [place] [address] [phone]

## 2020-12-11 ENCOUNTER — Other Ambulatory Visit: Payer: Self-pay | Admitting: Neurology

## 2021-01-02 ENCOUNTER — Other Ambulatory Visit: Payer: Self-pay | Admitting: Neurology

## 2021-01-30 IMAGING — CR DG ORBITS FOR FOREIGN BODY
2 series · 2 of 2 positions shown · non-contrast
Comparison: 03/10/2011 sinus radiographs.

CLINICAL DATA: Metal working/exposure; clearance prior to MRI

EXAM:
ORBITS FOR FOREIGN BODY - 2 VIEW

[w orbit pa (1 of 2)]
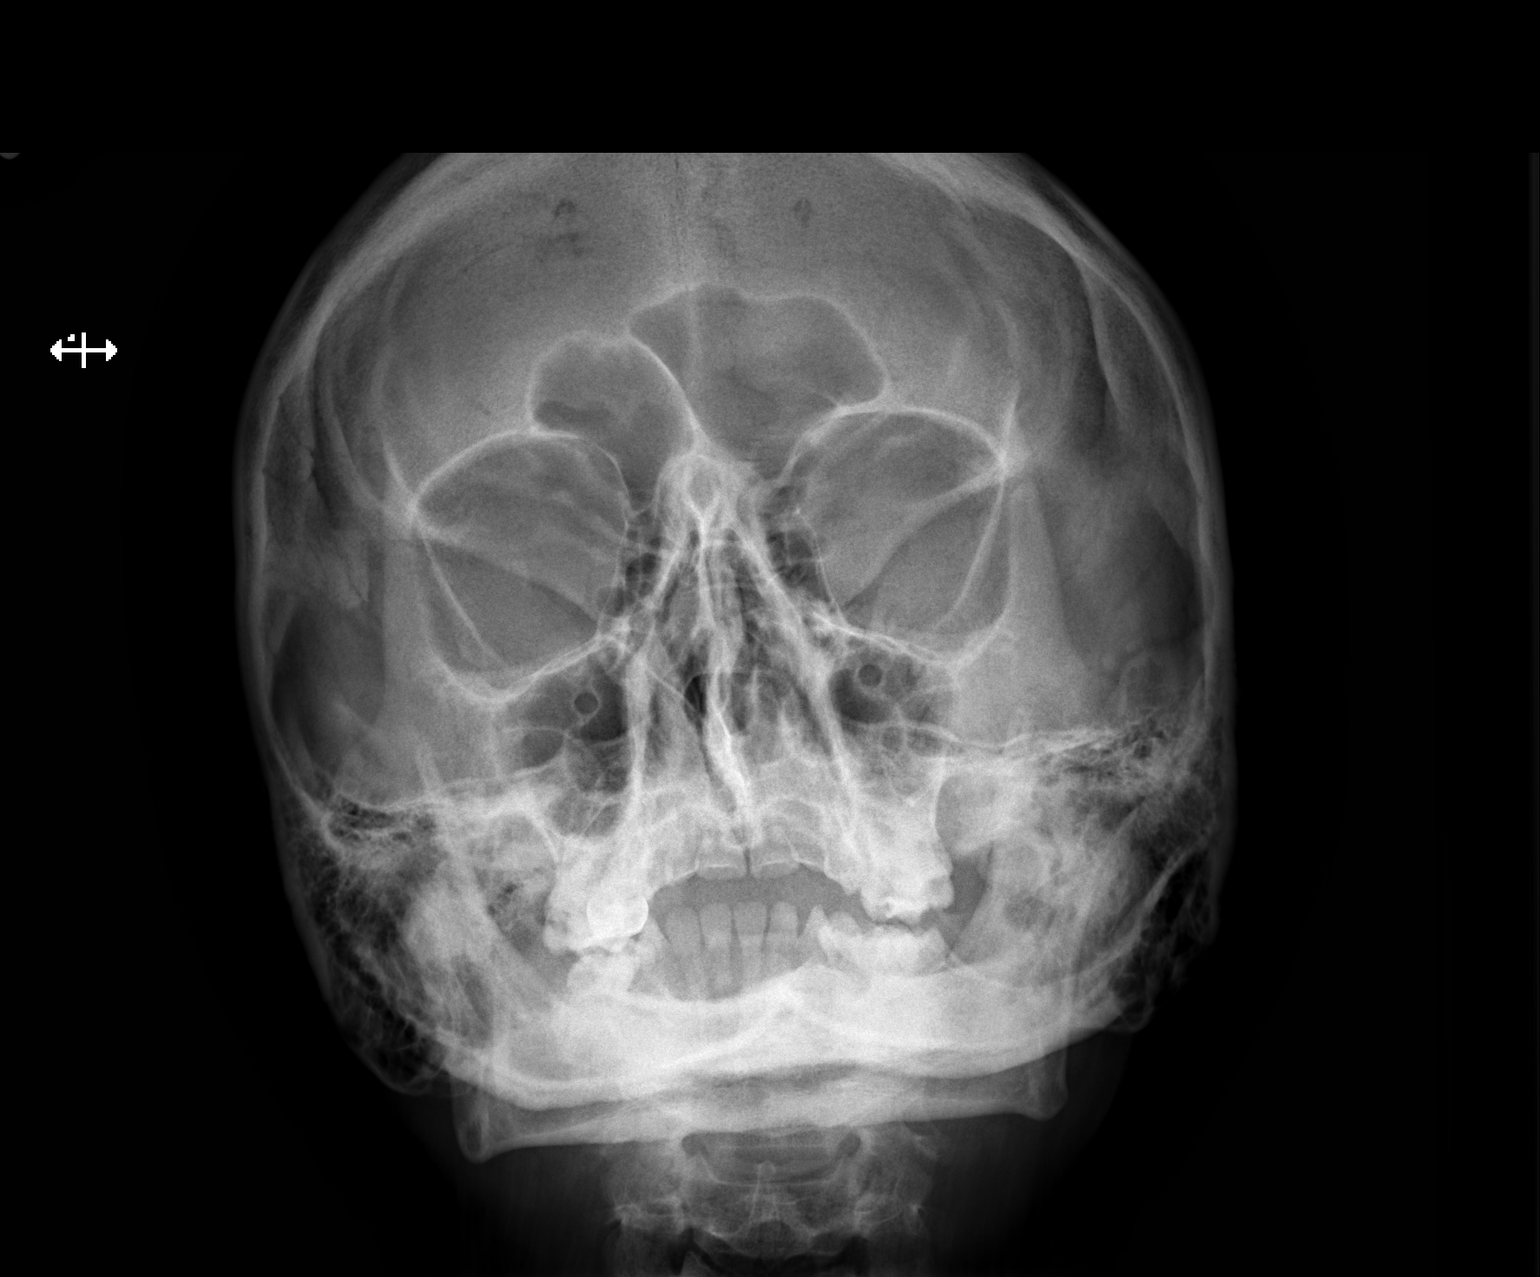

[w orbit pa (2 of 2)]
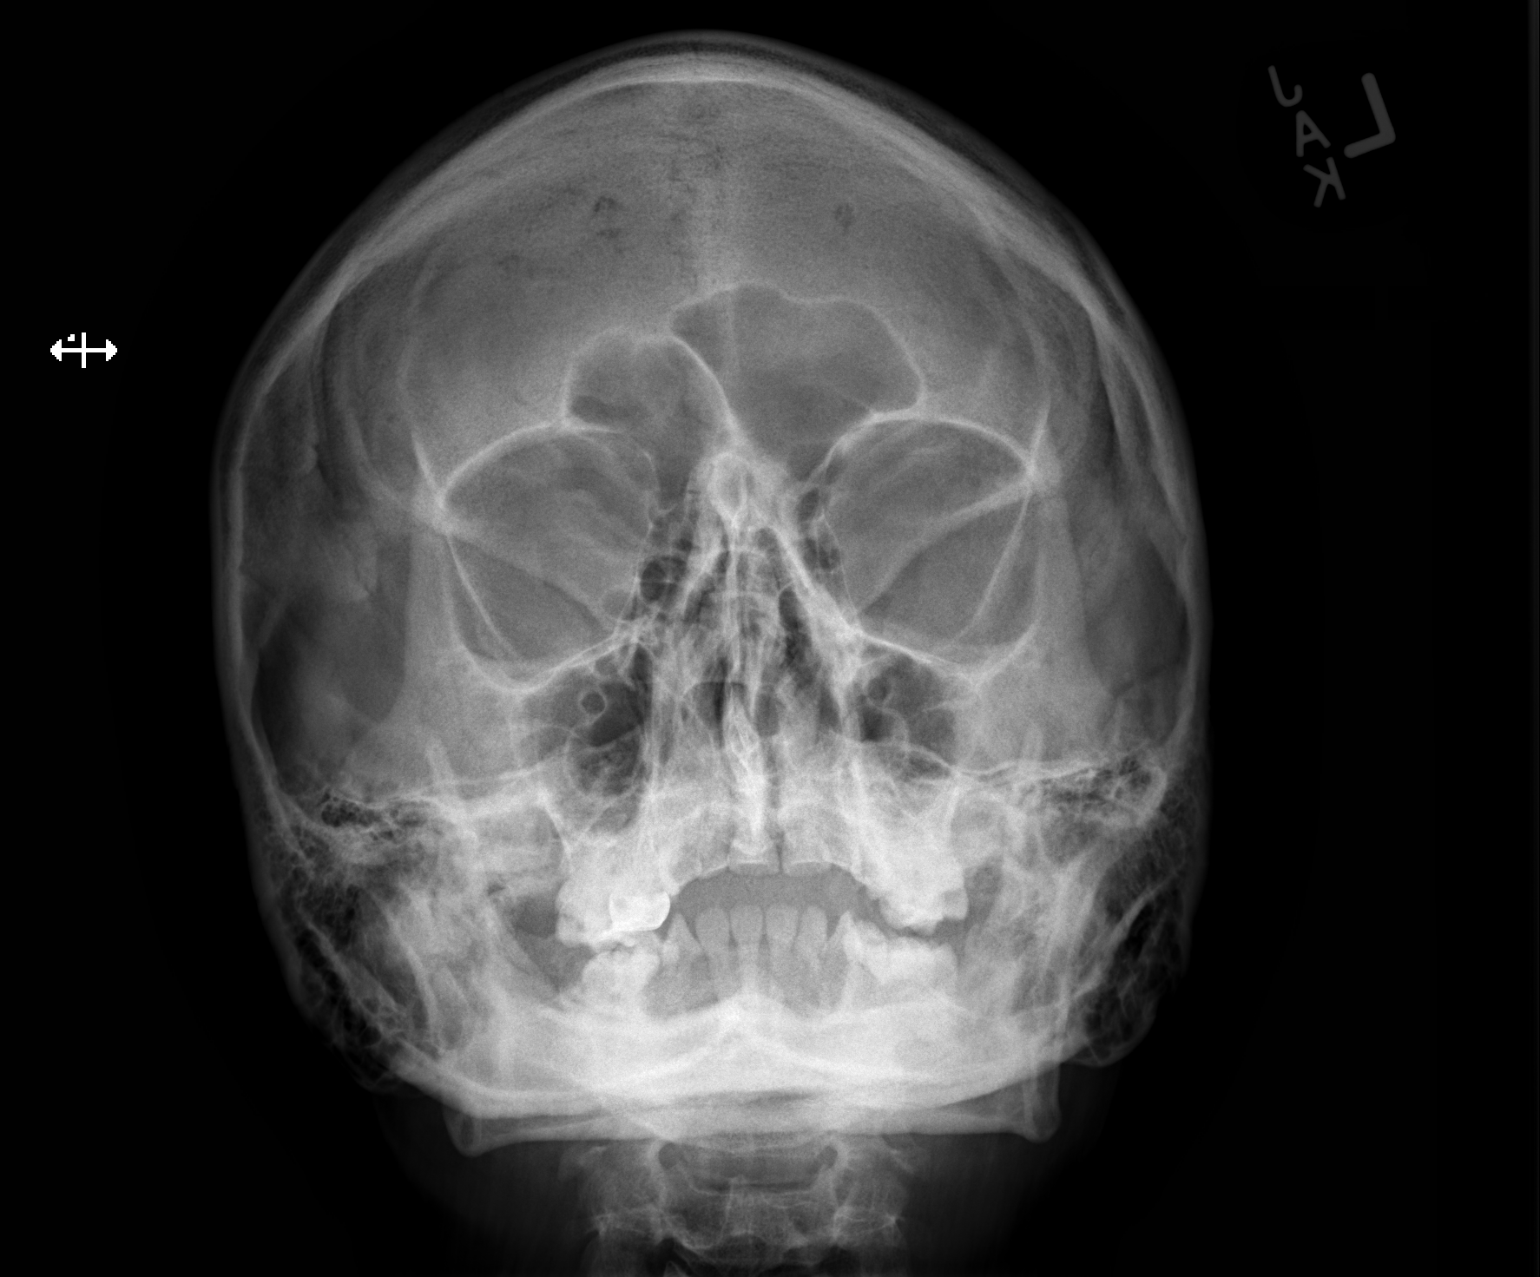

[2 of 2 positions shown; findings below may reference images not displayed]

FINDINGS: There is no evidence of metallic foreign body within the orbits. No
significant bone abnormality identified. Clear paranasal sinuses.
IMPRESSION: No evidence of metallic foreign body within the orbits.

## 2021-07-12 ENCOUNTER — Encounter: Payer: Self-pay | Admitting: Neurology

## 2021-07-12 ENCOUNTER — Ambulatory Visit: Payer: Medicaid Other | Admitting: Neurology

## 2021-07-12 VITALS — BP 109/78 | HR 115 | Ht 62.5 in | Wt 115.5 lb

## 2021-07-12 DIAGNOSIS — G43709 Chronic migraine without aura, not intractable, without status migrainosus: Secondary | ICD-10-CM

## 2021-07-12 MED ORDER — RIZATRIPTAN BENZOATE 10 MG PO TBDP: 10.0000 mg | ORAL_TABLET | ORAL | 6 refills | Status: AC | PRN

## 2021-07-12 MED ORDER — NORTRIPTYLINE HCL 25 MG PO CAPS: 50.0000 mg | ORAL_CAPSULE | Freq: Every day | ORAL | 1 refills | Status: AC

## 2021-07-12 NOTE — Progress Notes (Signed)
PATIENT: Monique Flores DOB: [birth year]  REASON FOR VISIT: follow up for headache HISTORY FROM: patient Primary Neurologist: Dr. Terrace Arabia   HISTORY  Monique Flores is a 26 year old female, seen in request by her primary care physician Dr. Juleen China for evaluation of headaches, she is currently [redacted] weeks pregnant, she is accompanied by her mother [name] at today's clinic visit on April 17, 2019.   I have reviewed and summarized the referring note from the referring physician.  She began to have migraine headaches since 26 years old, her typical migraine are bilateral frontal retro-orbital area pounding headache with associated light noise sensitivity, nauseous, lasting for few hours to few days   Over the past few years, she complains of increased frequency of migraine headache, especially since her most recent pregnancy, since 2019, she been having headaches 2-3 times each week, she usually suffers through it, recently tried Tylenol with limited help, has never taken preventive medication or triptan in the past   Patient reported she was seen by [facility] neurology in the past for abnormal MRI of the brain, around 26 years old, had a yearly MRI brain follow-up for a while, eventually lost to follow-up.   She denies visual loss, no lateralized motor or sensory deficit   Update Oct 28, 2019: She had a healthy delivery in December 2020, continue complains daily headache, moderate to severe, sometimes 10 out of 10, with associated light, noise, smell sensitivity, lasting 1 to 3 days, she has tried over-the-counter Tylenol, Excedrin Migraine with limited help   She denies focal symptoms, she also complains of poor sleep quality, she is a mother of 4 children at age 55, 30, 61 years old and 41 months old, also work part-time job during the weekend  Update April 06, 2020 SS: Laboratory evaluation revealed low normal hemoglobin 11.4, decreased MCV, MCH, MCHC, suspicious for IDA, working with PCP.   CMP, TSH unremarkable.  MRI of the brain in August 2021 showed right frontal subcortical area white matter lesion 1.3 x 0.7 cm, no contrast-enhancement, could be migraine related or small vessel disease, no acute abnormalities.  Taking nortriptyline 50 mg on as-needed basis, 2 to 4 migraines a week, takes Maxalt with good benefit.  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  Switched birth control from Depo to pills, but has not started, apparently isn't planning for more children.  Here today accompanied by her fianc.  Update July 12, 2021 SS: Here today accompanied by her fianc, wants to get back on nortriptyline for headache prevention, Maxalt as needed.  Has been out of her medication for about a year.  Complains of near daily headache, top of the head, left eye, typical headache pattern.  Taking ibuprofen few times a week, or goes to sleep for headache relief.  Works from [employer].  Has 4 kids, not planning for more, getting ready to start birth control.   REVIEW OF SYSTEMS: Out of a complete 14 system review of symptoms, the patient complains only of the following symptoms, and all other reviewed systems are negative.  Headache  ALLERGIES: No Known Allergies  HOME MEDICATIONS: Outpatient Medications Prior to Visit  Medication Sig Dispense Refill   ondansetron (ZOFRAN ODT) 4 MG disintegrating tablet Take 1 tablet (4 mg total) by mouth every 8 (eight) hours as needed. 20 tablet 6   nortriptyline (PAMELOR) 25 MG capsule Take 2 capsules (50 mg total)  by mouth at bedtime. 60 capsule 11   rizatriptan (MAXALT-MLT) 10 MG disintegrating tablet Take 1 tablet (10 mg total) by mouth as needed. May repeat in 2 hours if needed 15 tablet 6   No facility-administered medications prior to visit.    PAST MEDICAL HISTORY: Past Medical History:  Diagnosis Date   Asthma    Headache    Iron deficiency     PAST  SURGICAL HISTORY: Past Surgical History:  Procedure Laterality Date   NO PAST SURGERIES      FAMILY HISTORY: Family History  Problem Relation Age of Onset   Other Father        unsure of hx - not regularly involved - reported drug abuser   Diabetes Maternal Grandmother    Hypertension Maternal Grandfather    Hypertension Mother     SOCIAL HISTORY: Social History   Socioeconomic History   Marital status: Single    Spouse name: Not on file   Number of children: 4   Years of education: 12   Highest education level: Not on file  Occupational History   Occupation: monitors machines  Tobacco Use   Smoking status: Never   Smokeless tobacco: Never  Substance and Sexual Activity   Alcohol use: Not Currently   Drug use: Not Currently   Sexual activity: Not on file  Other Topics Concern   Not on file  Social History Narrative   Lives at home with fiance, children and maternal aunt.  She has three children.  Pregnant with fourth with EDD 06/23/2019.   Right-handed.   Caffeine use:  2-3 cups per day.   Social Determinants of Health   Financial Resource Strain: Not on file  Food Insecurity: Not on file  Transportation Needs: Not on file  Physical Activity: Not on file  Stress: Not on file  Social Connections: Not on file  Intimate Partner Violence: Not on file   PHYSICAL EXAM  Vitals:   07/12/21 1536  BP: 109/78  Pulse: (!) 115  Weight: 115 lb 8 oz (52.4 kg)  Height: 5' 2.5" (1.588 m)    Body mass index is 20.79 kg/m.  Generalized: Well developed, in no acute distress   Neurological examination  Mentation: Alert oriented to time, place, history taking. Follows all commands speech and language fluent Cranial nerve II-XII: Pupils were equal round reactive to light. Extraocular movements were full, visual field were full on confrontational test. Facial sensation and strength were normal. Head turning and shoulder shrug were normal and symmetric. Motor: The motor  testing reveals 5 over 5 strength of all 4 extremities. Good symmetric motor tone is noted throughout.  Sensory: Sensory testing is intact to soft touch on all 4 extremities. No evidence of extinction is noted.  Coordination: Cerebellar testing reveals good finger-nose-finger and heel-to-shin bilaterally.  Gait and station: Gait is normal. Reflexes: Deep tendon reflexes are symmetric and normal bilaterally.   DIAGNOSTIC DATA (LABS, IMAGING, TESTING) - I reviewed patient records, labs, notes, testing and imaging myself where available.  Lab Results  Component Value Date   WBC 5.0 10/28/2019   HGB 11.4 10/28/2019   HCT 36.5 10/28/2019   MCV 77 (L) 10/28/2019   PLT 311 10/28/2019      Component Value Date/Time   NA 139 10/28/2019 1235   K 4.2 10/28/2019 1235   CL 103 10/28/2019 1235   CO2 22 10/28/2019 1235   GLUCOSE 74 10/28/2019 1235   BUN 9 10/28/2019 1235   CREATININE 0.88 10/28/2019  1235   CALCIUM 9.1 10/28/2019 1235   PROT 6.8 10/28/2019 1235   ALBUMIN 4.4 10/28/2019 1235   AST 15 10/28/2019 1235   ALT 8 10/28/2019 1235   ALKPHOS 75 10/28/2019 1235   BILITOT 0.6 10/28/2019 1235   GFRNONAA 93 10/28/2019 1235   GFRAA 107 10/28/2019 1235   No results found for: CHOL, HDL, LDLCALC, LDLDIRECT, TRIG, CHOLHDL No results found for: JXBJ4N No results found for: VITAMINB12 Lab Results  Component Value Date   TSH 0.793 10/28/2019      ASSESSMENT AND PLAN 26 y.o. year old female  has a past medical history of Asthma, Headache, and Iron deficiency. here with:  1.  Chronic migraine headache -Restart nortriptyline as preventative medication, working up to 50 mg at bedtime -Take Maxalt as needed for acute headache -Call for dose adjustment, follow-up 6 months or sooner if needed  2.  Abnormal MRI of the brain -Consider repeat imaging in the future, reportedly monitored frequently since she was a child -MRI of the brain in August 2021 showed right frontal subcortical area  white matter hypodensity lesion, 1.3 x 0.7 cm, no contrast-enhancement, could be migraine related or small vessel disease, no acute abnormalities  Margie Ege, AGNP-C, DNP 07/12/2021, 4:04 PM [facility] [address] [place], [place] [address] [phone]

## 2021-07-12 NOTE — Patient Instructions (Signed)
Restart the nortriptyline working up to 50 mg at bedtime, start taking 25 mg at bedtime for 2 weeks then increase to 50 mg, use Maxalt as needed for acute headache  Call for any dose adjustment, see you back in 6 months

## 2022-01-05 NOTE — Progress Notes (Deleted)
PATIENT: Monique Flores DOB: 05/06/1996  REASON FOR VISIT: follow up for headache HISTORY FROM: patient Primary Neurologist: Dr. Krista Blue   HISTORY  Monique Flores is a 26 year old female, seen in request by her primary care physician Dr. Kennith Maes for evaluation of headaches, she is currently [redacted] weeks pregnant, she is accompanied by her mother Neoma Laming at today's clinic visit on April 17, 2019.   I have reviewed and summarized the referring note from the referring physician.  She began to have migraine headaches since 26 years old, her typical migraine are bilateral frontal retro-orbital area pounding headache with associated light noise sensitivity, nauseous, lasting for few hours to few days   Over the past few years, she complains of increased frequency of migraine headache, especially since her most recent pregnancy, since 2019, she been having headaches 2-3 times each week, she usually suffers through it, recently tried Tylenol with limited help, has never taken preventive medication or triptan in the past   Patient reported she was seen by [facility] neurology in the past for abnormal MRI of the brain, around 26 years old, had a yearly MRI brain follow-up for a while, eventually lost to follow-up.   She denies visual loss, no lateralized motor or sensory deficit   Update Oct 28, 2019: She had a healthy delivery in December 2020, continue complains daily headache, moderate to severe, sometimes 10 out of 10, with associated light, noise, smell sensitivity, lasting 1 to 3 days, she has tried over-the-counter Tylenol, Excedrin Migraine with limited help   She denies focal symptoms, she also complains of poor sleep quality, she is a mother of 4 children at age 32, 68, 35 years old and 1 months old, also work part-time job during the weekend  Update April 06, 2020 SS: Laboratory evaluation revealed low normal hemoglobin 11.4, decreased MCV, MCH, MCHC, suspicious for IDA, working with PCP.   CMP, TSH unremarkable.  MRI of the brain in August 2021 showed right frontal subcortical area white matter lesion 1.3 x 0.7 cm, no contrast-enhancement, could be migraine related or small vessel disease, no acute abnormalities.  Taking nortriptyline 50 mg on as-needed basis, 2 to 4 migraines a week, takes Maxalt with good benefit.  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  Switched birth control from Depo to pills, but has not started, apparently isn't planning for more children.  Here today accompanied by her fianc.  Update July 12, 2021 SS: Here today accompanied by her fianc, wants to get back on nortriptyline for headache prevention, Maxalt as needed.  Has been out of her medication for about a year.  Complains of near daily headache, top of the head, left eye, typical headache pattern.  Taking ibuprofen few times a week, or goes to sleep for headache relief.  Works from [employer] (medical).  Has 4 kids, not planning for more, getting ready to start birth control.  Update January 10, 2022 SS:    REVIEW OF SYSTEMS: Out of a complete 14 system review of symptoms, the patient complains only of the following symptoms, and all other reviewed systems are negative.  Headache  ALLERGIES: No Known Allergies  HOME MEDICATIONS: Outpatient Medications Prior to Visit  Medication Sig Dispense Refill   nortriptyline (PAMELOR) 25 MG capsule Take 2 capsules (50 mg total) by mouth at bedtime. 180 capsule 1   ondansetron (ZOFRAN ODT) 4 MG disintegrating tablet Take 1 tablet (  4 mg total) by mouth every 8 (eight) hours as needed. 20 tablet 6   rizatriptan (MAXALT-MLT) 10 MG disintegrating tablet Take 1 tablet (10 mg total) by mouth as needed. May repeat in 2 hours if needed 15 tablet 6   No facility-administered medications prior to visit.    PAST MEDICAL HISTORY: Past Medical History:  Diagnosis Date   Asthma    Headache     Iron deficiency     PAST SURGICAL HISTORY: Past Surgical History:  Procedure Laterality Date   NO PAST SURGERIES      FAMILY HISTORY: Family History  Problem Relation Age of Onset   Other Father        unsure of hx - not regularly involved - reported drug abuser   Diabetes Maternal Grandmother    Hypertension Maternal Grandfather    Hypertension Mother     SOCIAL HISTORY: Social History   Socioeconomic History   Marital status: Single    Spouse name: Not on file   Number of children: 4   Years of education: 12   Highest education level: Not on file  Occupational History   Occupation: monitors machines  Tobacco Use   Smoking status: Never   Smokeless tobacco: Never  Substance and Sexual Activity   Alcohol use: Not Currently   Drug use: Not Currently   Sexual activity: Not on file  Other Topics Concern   Not on file  Social History Narrative   Lives at home with fiance, children and maternal aunt.  She has three children.  Pregnant with fourth with EDD 06/23/2019.   Right-handed.   Caffeine use:  2-3 cups per day.   Social Determinants of Health   Financial Resource Strain: Not on file  Food Insecurity: Not on file  Transportation Needs: Not on file  Physical Activity: Not on file  Stress: Not on file  Social Connections: Not on file  Intimate Partner Violence: Not on file   PHYSICAL EXAM  There were no vitals filed for this visit.   There is no height or weight on file to calculate BMI.  Generalized: Well developed, in no acute distress   Neurological examination  Mentation: Alert oriented to time, place, history taking. Follows all commands speech and language fluent Cranial nerve II-XII: Pupils were equal round reactive to light. Extraocular movements were full, visual field were full on confrontational test. Facial sensation and strength were normal. Head turning and shoulder shrug were normal and symmetric. Motor: The motor testing reveals 5 over 5  strength of all 4 extremities. Good symmetric motor tone is noted throughout.  Sensory: Sensory testing is intact to soft touch on all 4 extremities. No evidence of extinction is noted.  Coordination: Cerebellar testing reveals good finger-nose-finger and heel-to-shin bilaterally.  Gait and station: Gait is normal. Reflexes: Deep tendon reflexes are symmetric and normal bilaterally.   DIAGNOSTIC DATA (LABS, IMAGING, TESTING) - I reviewed patient records, labs, notes, testing and imaging myself where available.  Lab Results  Component Value Date   WBC 5.0 10/28/2019   HGB 11.4 10/28/2019   HCT 36.5 10/28/2019   MCV 77 (L) 10/28/2019   PLT 311 10/28/2019      Component Value Date/Time   NA 139 10/28/2019 1235   K 4.2 10/28/2019 1235   CL 103 10/28/2019 1235   CO2 22 10/28/2019 1235   GLUCOSE 74 10/28/2019 1235   BUN 9 10/28/2019 1235   CREATININE 0.88 10/28/2019 1235   CALCIUM 9.1 10/28/2019 1235  PROT 6.8 10/28/2019 1235   ALBUMIN 4.4 10/28/2019 1235   AST 15 10/28/2019 1235   ALT 8 10/28/2019 1235   ALKPHOS 75 10/28/2019 1235   BILITOT 0.6 10/28/2019 1235   GFRNONAA 93 10/28/2019 1235   GFRAA 107 10/28/2019 1235   No results found for: "CHOL", "HDL", "LDLCALC", "LDLDIRECT", "TRIG", "CHOLHDL" No results found for: "HGBA1C" No results found for: "VITAMINB12" Lab Results  Component Value Date   TSH 0.793 10/28/2019   ASSESSMENT AND PLAN 26 y.o. year old female  has a past medical history of Asthma, Headache, and Iron deficiency. here with:  1.  Chronic migraine headache -Restart nortriptyline as preventative medication, working up to 50 mg at bedtime -Take Maxalt as needed for acute headache -Call for dose adjustment, follow-up 6 months or sooner if needed  2.  Abnormal MRI of the brain -Consider repeat imaging in the future, reportedly monitored frequently since she was a child -MRI of the brain in August 2021 showed right frontal subcortical area white matter  hypodensity lesion, 1.3 x 0.7 cm, no contrast-enhancement, could be migraine related or small vessel disease, no acute abnormalities  Margie Ege, AGNP-C, DNP 01/05/2022, 4:00 PM [facility] [address] [place], [place] [address] [phone]

## 2022-01-10 ENCOUNTER — Ambulatory Visit: Payer: Medicaid Other | Admitting: Neurology
